# Patient Record
Sex: Male | Born: 1960 | Race: White | Hispanic: No | Marital: Married | Smoking: Never smoker
Health system: Southern US, Community
[De-identification: ages and names within clinical notes are randomized; demographics above are authoritative.]

## PROBLEM LIST (undated history)

## (undated) DIAGNOSIS — R030 Elevated blood-pressure reading, without diagnosis of hypertension: Secondary | ICD-10-CM

## (undated) DIAGNOSIS — E785 Hyperlipidemia, unspecified: Secondary | ICD-10-CM

## (undated) HISTORY — DX: Elevated blood-pressure reading, without diagnosis of hypertension: R03.0

## (undated) HISTORY — DX: Hyperlipidemia, unspecified: E78.5

---

## 2002-05-25 ENCOUNTER — Ambulatory Visit (HOSPITAL_COMMUNITY): Admission: RE | Admit: 2002-05-25 | Discharge: 2002-05-25 | Payer: Self-pay | Admitting: Gastroenterology

## 2003-04-15 ENCOUNTER — Encounter: Admission: RE | Admit: 2003-04-15 | Discharge: 2003-04-15 | Payer: Self-pay | Admitting: Internal Medicine

## 2003-04-15 ENCOUNTER — Encounter: Payer: Self-pay | Admitting: Internal Medicine

## 2003-12-09 ENCOUNTER — Other Ambulatory Visit: Admission: RE | Admit: 2003-12-09 | Discharge: 2003-12-09 | Payer: Self-pay | Admitting: Otolaryngology

## 2008-07-19 ENCOUNTER — Ambulatory Visit (HOSPITAL_BASED_OUTPATIENT_CLINIC_OR_DEPARTMENT_OTHER): Admission: RE | Admit: 2008-07-19 | Discharge: 2008-07-19 | Payer: Self-pay | Admitting: Orthopaedic Surgery

## 2008-10-05 ENCOUNTER — Encounter: Admission: RE | Admit: 2008-10-05 | Discharge: 2008-10-05 | Payer: Self-pay | Admitting: Internal Medicine

## 2010-01-02 IMAGING — US US SOFT TISSUE HEAD/NECK
1 series · 14 of 25 positions shown · non-contrast
Comparison: None

CLINICAL DATA: Thyroid enlargement/swelling.  Fatigue

THYROID ULTRASOUND
TECHNIQUE: Ultrasound examination of the thyroid gland and
adjacent soft tissues was performed.

[Series 1: us soft tissue head/neck · 0.10mm/px · 14 of 28 slices shown]
[im 1/28]
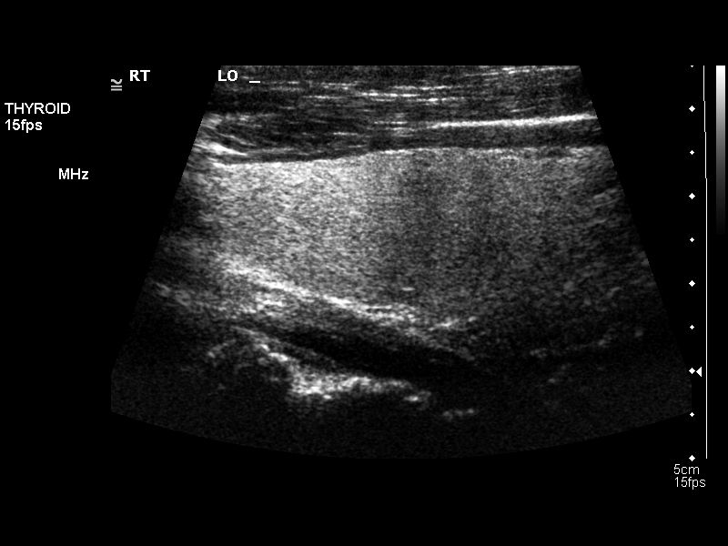
[im 3/28]
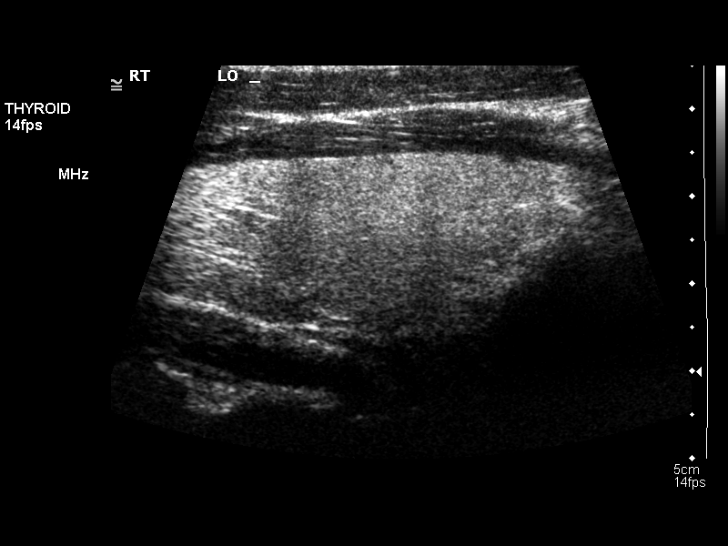
[im 5/28]
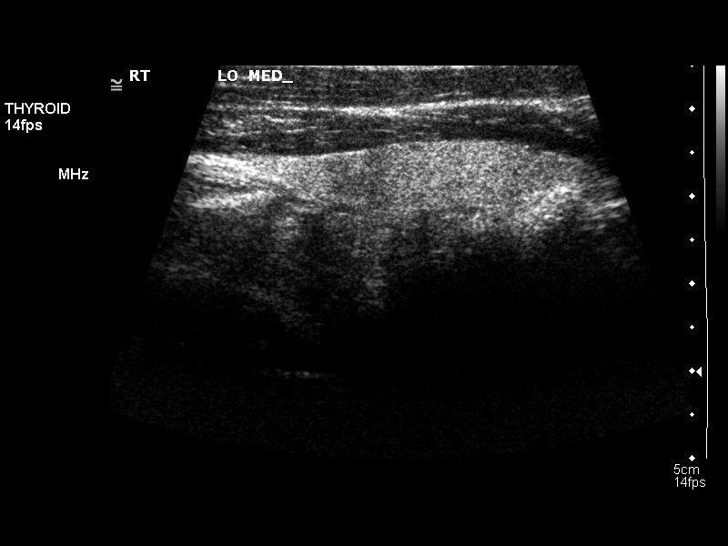
[im 7/28]
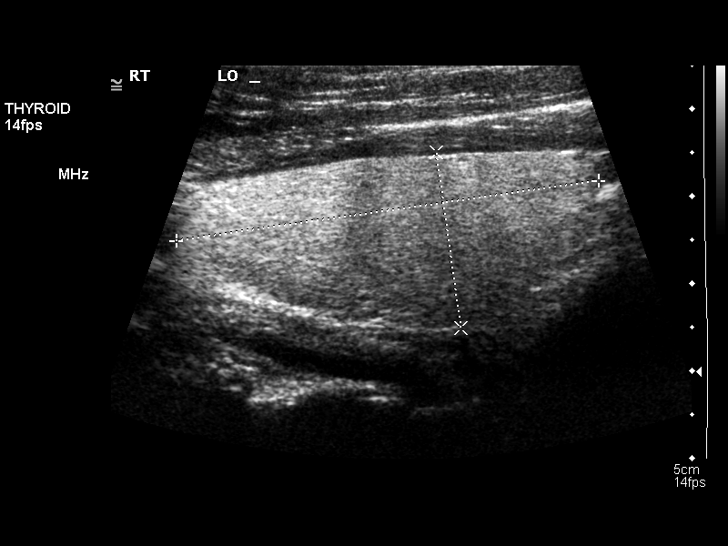
[im 10/28]
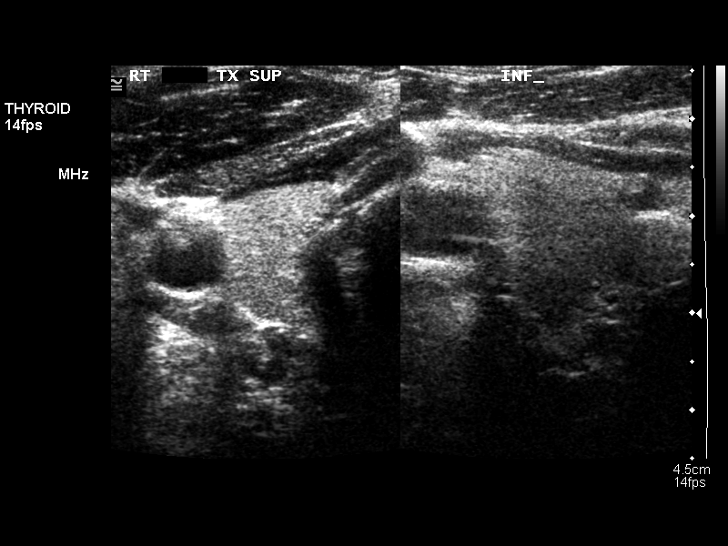
[im 11/28]
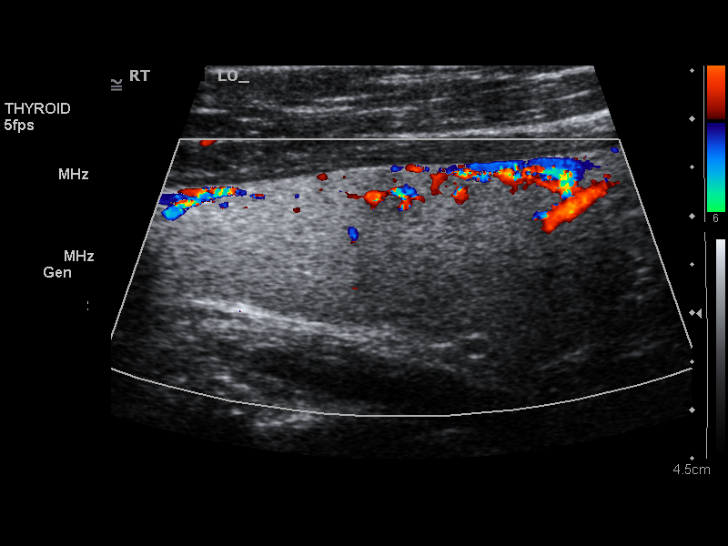
[im 13/28]
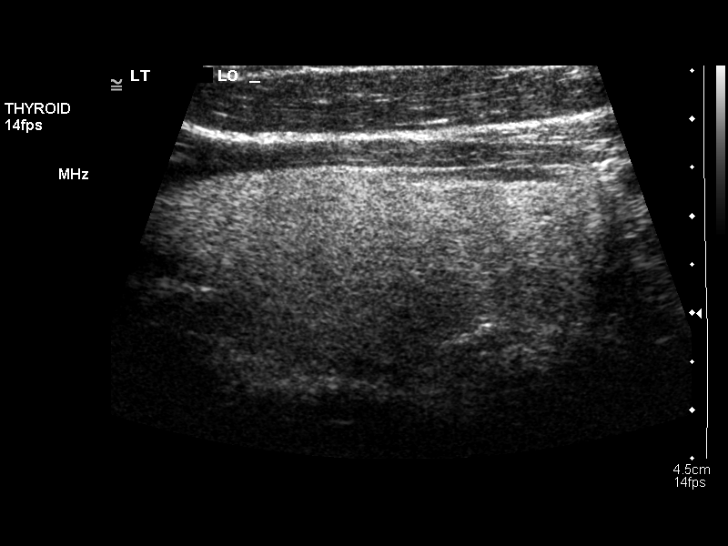
[im 15/28]
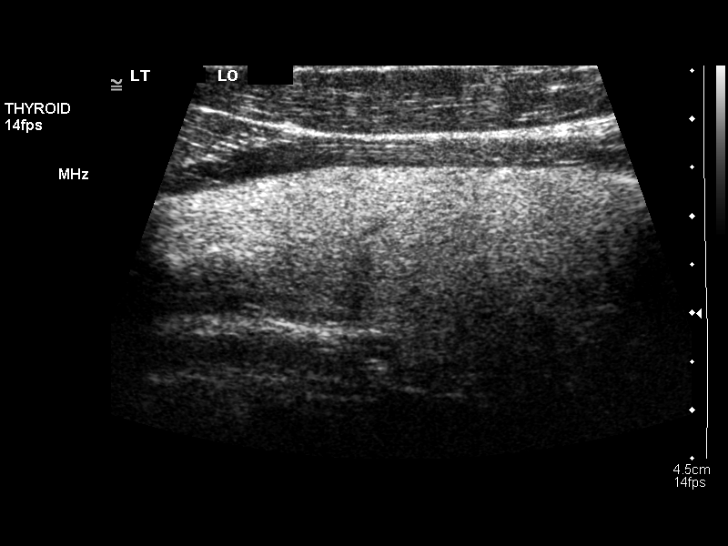
[im 17/28]
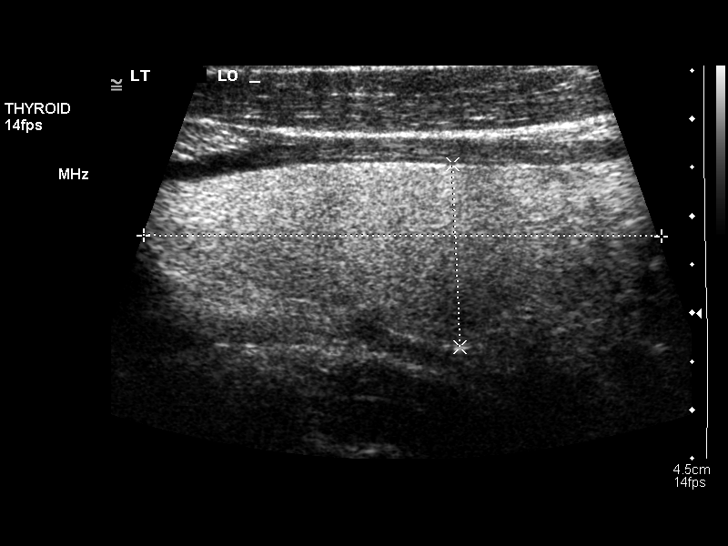
[im 19/28]
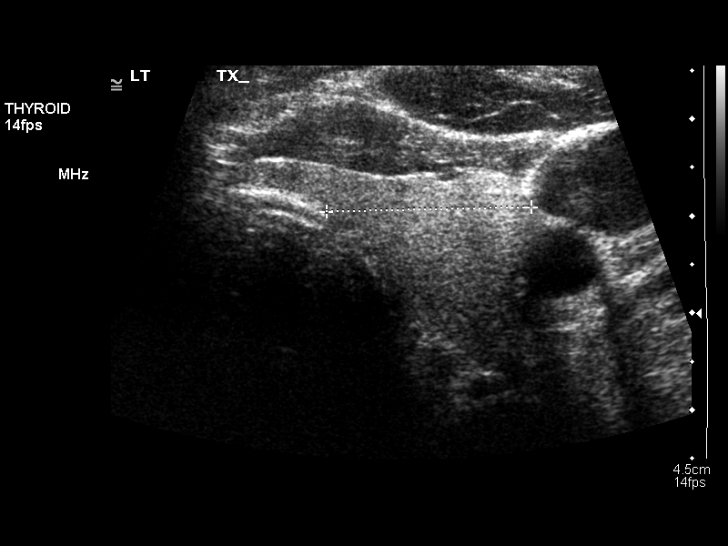
[im 21/28]
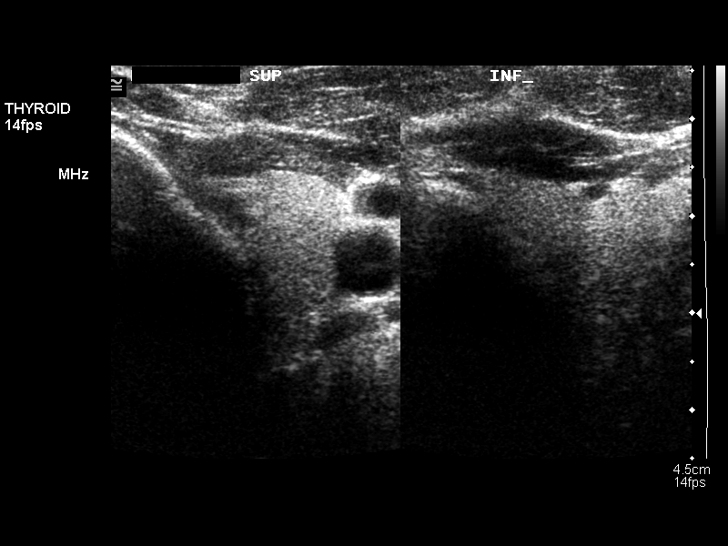
[im 23/28]
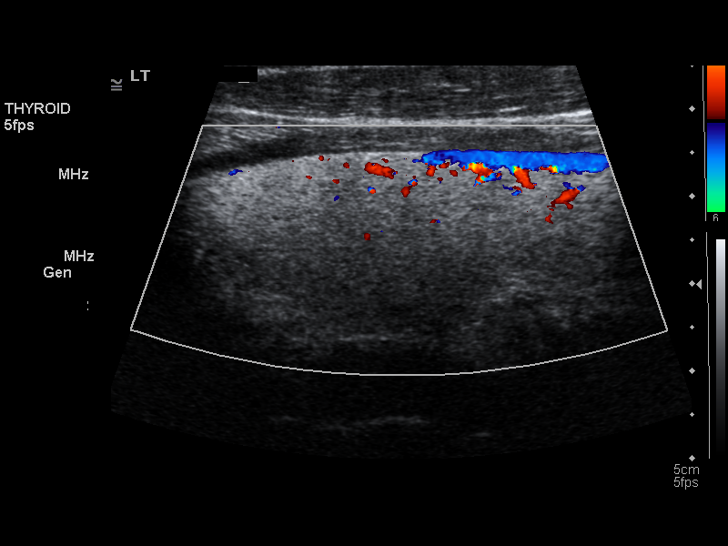
[im 25/28]
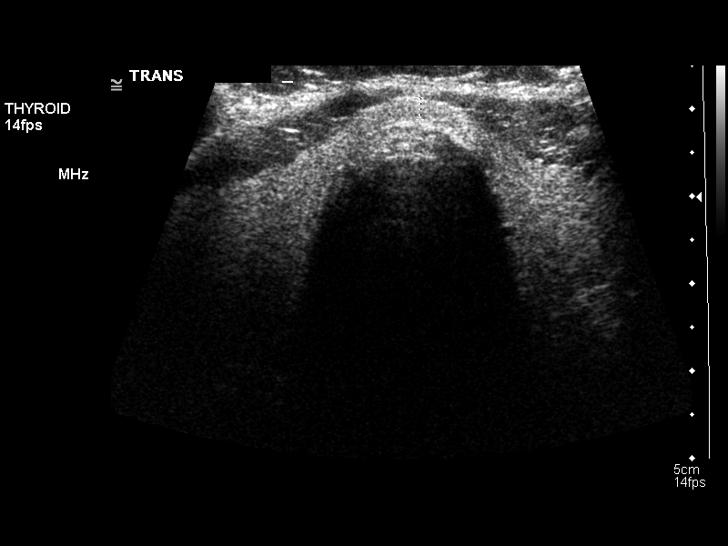
[im 28/28]
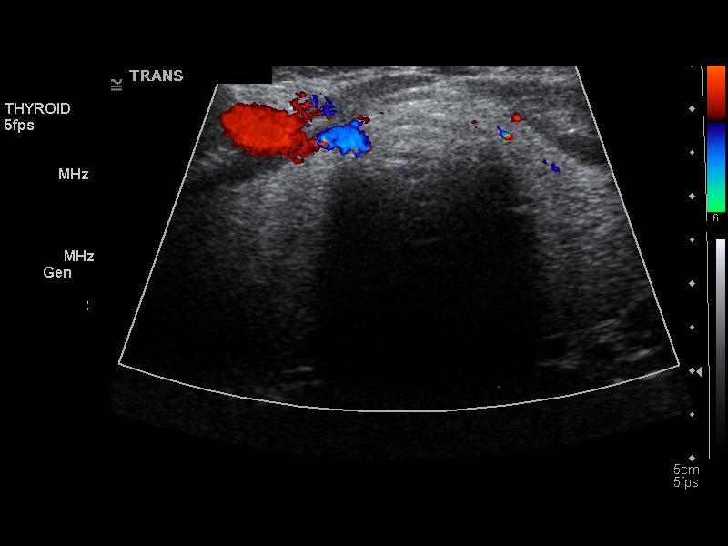

[14 of 25 positions shown; findings below may reference images not displayed]

FINDINGS: The thyroid gland is homogeneous in echotexture.  No
nodules are identified.  The right lobe measures 5.2 x 1.8 x 2.7 cm
and the left lobe measures 5.3 x 2.0 x 2.1 cm.
IMPRESSION: Normal thyroid ultrasound.

## 2010-06-30 ENCOUNTER — Emergency Department (HOSPITAL_BASED_OUTPATIENT_CLINIC_OR_DEPARTMENT_OTHER): Admission: EM | Admit: 2010-06-30 | Discharge: 2010-06-30 | Payer: Self-pay | Admitting: Emergency Medicine

## 2011-02-19 NOTE — Op Note (Signed)
Mario Burke, Mario Burke               ACCOUNT NO.:  [id number]   MEDICAL RECORD NO.:  [id number]          PATIENT TYPE:  AMB   LOCATION:  DSC                          FACILITY:  [facility]   PHYSICIAN:  Lubertha Basque. Dalldorf, M.D.DATE OF BIRTH:  1961/02/03   DATE OF PROCEDURE:  07/19/2008  DATE OF DISCHARGE:                               OPERATIVE REPORT   PREOPERATIVE DIAGNOSES:  1. Left knee torn medial meniscus.  2. Left knee chondromalacia patella.   POSTOPERATIVE DIAGNOSES:  1. Left knee torn medial meniscus.  2. Left knee chondromalacia patella.   PROCEDURES:  1. Left knee partial medial meniscectomy.  2. Left knee abrasion and chondroplasty, patellofemoral.   ANESTHESIA:  General.   ATTENDING SURGEON:  Lubertha Basque. Jerl Santos, MD   ASSISTANT:  Lindwood Qua, PA   INDICATIONS FOR PROCEDURE:  The patient is a 50 year old male with about  3 months of left knee pain with mechanical symptoms.  This may have  started on playground with his child running up a slide.  Nevertheless,  he has persisted with pain and swelling and some mechanical catching.  By exam and history, he is presumed to have a displaced meniscal tear.  He is offered an arthroscopy.  Informed operative consent was obtained  after discussion of possible complications of reaction to anesthesia and  infection.   SUMMARY/FINDINGS/PROCEDURE:  Under general anesthesia, an arthroscopy of  the left knee was performed.  Suprapatellar pouch was benign while the  patellofemoral joint exhibited grade 3 and 4 breakdown in the  intertrochlear groove over a relatively broad area.  A thorough  chondroplasty was done along with abrasion to bleeding bone in a tiny  location.  In the medial compartment, he did have a displaced medial  meniscus tear, bucket handle stuck in an anterior position.  This was  removed, performing about a 25% partial medial meniscectomy.  This was  taken back to stable tissues.  There were no degenerative  changes in  this compartment.  ACL was intact.  Lateral compartment exhibited an  insignificant central tear addressed with a brief partial lateral  meniscectomy, but there were no degenerative changes in this  compartment.   DESCRIPTION OF PROCEDURE:  The patient was taken to the operating suite  where general anesthetic was applied without difficulty.  He was  positioned supine and prepped and draped in normal sterile fashion.  After the administration of IV Kefzol, an arthroscopy of the left knee  was performed through a total of 2 portals.  Findings were as noted  above and procedure consisted of the partial medial meniscectomy done  with basket and shaver followed by the abrasion chondroplasty to  bleeding bone in the intertrochlear groove.  The knee was thoroughly  irrigated followed by placement Marcaine with epinephrine and morphine.  Adaptic was placed over the portals followed by dry gauze and loose Ace  wrap.  Estimated blood loss and intraoperative fluids can be obtained  from the anesthesia records.   DISPOSITION:  The patient was extubated in the operating room and taken  to recovery room in stable addition.  He  is to go home same-day and  follow up in the office closely.  I will contact him by phone tonight.      Lubertha Basque Jerl Santos, M.D.  Electronically Signed     PGD/MEDQ  D:  07/19/2008  T:  07/20/2008  Job:  [id number]

## 2012-02-18 DIAGNOSIS — F329 Major depressive disorder, single episode, unspecified: Secondary | ICD-10-CM | POA: Insufficient documentation

## 2013-07-09 ENCOUNTER — Telehealth: Payer: Self-pay | Admitting: *Deleted

## 2013-07-09 NOTE — Telephone Encounter (Signed)
Pt would like to reorder orthotic, please call.

## 2013-07-13 NOTE — Telephone Encounter (Signed)
Mario Burke  WE WILL GO TO CLASS ON TOMORROW 07/14/13 I WILL ASK JUSTIN WHAT WE ARE TO DO REGARDING OUR ORTHOTIC PATIENT RECEIVING A SECOND PAIR WHEN THEY ARE NOT SEEING THE DOCTOR. WILL LET  YOU KNOW REGARDING THIS PATIENT.

## 2013-07-13 NOTE — Telephone Encounter (Signed)
Can you check if patient is filing these with insurance or paying out of pocket before I order these?

## 2013-07-13 NOTE — Telephone Encounter (Signed)
Spoke to pt...requested that we check his insurance first to see if covered and at how much. Pt has an old pair that he may just get refurbished if his cost ends up being higher. Can you call his insurance and check coverage and let the pt know?

## 2013-07-21 ENCOUNTER — Encounter (INDEPENDENT_AMBULATORY_CARE_PROVIDER_SITE_OTHER): Payer: Self-pay

## 2013-07-21 ENCOUNTER — Encounter: Payer: Self-pay | Admitting: Cardiovascular Disease

## 2013-07-21 ENCOUNTER — Ambulatory Visit (INDEPENDENT_AMBULATORY_CARE_PROVIDER_SITE_OTHER): Payer: BC Managed Care – PPO | Admitting: Cardiovascular Disease

## 2013-07-21 VITALS — BP 134/92 | HR 79 | Ht 75.5 in | Wt 240.0 lb

## 2013-07-21 DIAGNOSIS — R0600 Dyspnea, unspecified: Secondary | ICD-10-CM | POA: Insufficient documentation

## 2013-07-21 DIAGNOSIS — R079 Chest pain, unspecified: Secondary | ICD-10-CM

## 2013-07-21 DIAGNOSIS — R0609 Other forms of dyspnea: Secondary | ICD-10-CM

## 2013-07-21 DIAGNOSIS — D582 Other hemoglobinopathies: Secondary | ICD-10-CM

## 2013-07-21 LAB — BASIC METABOLIC PANEL
Calcium: 9.5 mg/dL (ref 8.4–10.5)
GFR: 87.12 mL/min (ref >60.00)
Potassium: 4.4 mEq/L (ref 3.5–5.1)
Sodium: 137 mEq/L (ref 135–145)

## 2013-07-21 LAB — HEMOGLOBIN: Hemoglobin: 16.5 g/dL (ref 13.0–17.0)

## 2013-07-21 LAB — D-DIMER, QUANTITATIVE: D-Dimer, Quant: 0.44 ug/mL-FEU (ref 0.00–0.48)

## 2013-07-21 LAB — HEMATOCRIT: HCT: 46.8 % (ref 39.0–52.0)

## 2013-07-21 NOTE — Patient Instructions (Signed)
Your physician recommends that you schedule a follow-up appointment in:  AS NEEDED   Your physician has requested that you have an echocardiogram. Echocardiography is a painless test that uses sound waves to create images of your heart. It provides your doctor with information about the size and shape of your heart and how well your heart's chambers and valves are working. This procedure takes approximately one hour. There are no restrictions for this procedure.   Your physician has requested that you have an exercise tolerance test. For further information please visit [email or web address] Please also follow instruction sheet, as given.  Your physician recommends that you return for lab work in:  TODAY   HGB  HCT  BMET   D DIMER

## 2013-07-21 NOTE — Assessment & Plan Note (Signed)
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  RBC mass No signs of hypoxia  Echo

## 2013-07-21 NOTE — Assessment & Plan Note (Signed)
CXR normal Check d dimer and echo for RV/LV function

## 2013-07-21 NOTE — Progress Notes (Signed)
Patient ID: Mario Burke, male   DOB: Feb 15, 1961, 52 y.o.   MRN: [id number] 52 yo referred by [facility] urgent care Seen there 9/28 for chest pain and dyspnea Sudden onset while working in yard.  Tightness in chest No palpitations or syncope.  Reviewed records.  ECG ok. CXR ok  R/O  Did not see d dimer.  Hb was elevated at 17.7  BUN Cr normal CPK elevated in 400-500 range low MB and negative Troponin T  Had gotten hot and maybe dehydrated.  Pain squeezing and intermitant Lasted overall a few hours.  Not positional or pleuritic dyspnea was sudden in onset.  Not smoker with no chronic lung disease cough fever or sputum  ROS: Denies fever, malais, weight loss, blurry vision, decreased visual acuity, cough, sputum, SOB, hemoptysis, pleuritic pain, palpitaitons, heartburn, abdominal pain, melena, lower extremity edema, claudication, or rash.  All other systems reviewed and negative   General: Affect appropriate Healthy:  appears stated age HEENT: normal Neck supple with no adenopathy JVP normal no bruits no thyromegaly Lungs clear with no wheezing and good diaphragmatic motion Heart:  S1/S2 no murmur,rub, gallop or click PMI normal Abdomen: benighn, BS positve, no tenderness, no AAA no bruit.  No HSM or HJR Distal pulses intact with no bruits No edema Neuro non-focal Skin warm and dry No muscular weakness  Medications Current Outpatient Prescriptions  Medication Sig Dispense Refill  . ALPRAZolam (XANAX) 0.5 MG tablet Take half tablet about one or two times a week as needed      . testosterone cypionate (DEPOTESTOTERONE CYPIONATE) 100 MG/ML injection Inject 200 mg into the muscle every 14 (fourteen) days. For IM use only      . venlafaxine XR (EFFEXOR-XR) 150 MG 24 hr capsule Take 150 mg by mouth daily.       No current facility-administered medications for this visit.    Allergies Review of patient's allergies indicates not on file.  Family History: No family history on  file.  Social History: History   Social History  . Marital Status: Married    Spouse Name: N/A    Number of Children: N/A  . Years of Education: N/A   Occupational History  . Not on file.   Social History Main Topics  . Smoking status: Never Smoker   . Smokeless tobacco: Not on file  . Alcohol Use: Yes     Comment: occasionally  . Drug Use: No  . Sexual Activity: Not on file   Other Topics Concern  . Not on file   Social History Narrative  . No narrative on file    Electrocardiogram:  SR rate 71  Normal   Assessment and Plan

## 2013-07-21 NOTE — Assessment & Plan Note (Signed)
Resolved normal ECG  F/U ETT

## 2013-07-27 ENCOUNTER — Other Ambulatory Visit: Payer: Self-pay | Admitting: *Deleted

## 2013-07-27 DIAGNOSIS — M722 Plantar fascial fibromatosis: Secondary | ICD-10-CM

## 2013-07-27 NOTE — Telephone Encounter (Signed)
Ordered pt's orthotics today. Will call when they arrive!

## 2013-07-27 NOTE — Telephone Encounter (Signed)
Did pt get a call re: insurance coverage with orthotics?

## 2013-08-04 ENCOUNTER — Ambulatory Visit (HOSPITAL_COMMUNITY): Payer: BC Managed Care – PPO | Attending: Cardiovascular Disease | Admitting: Radiology

## 2013-08-04 DIAGNOSIS — I079 Rheumatic tricuspid valve disease, unspecified: Secondary | ICD-10-CM | POA: Insufficient documentation

## 2013-08-04 DIAGNOSIS — R079 Chest pain, unspecified: Secondary | ICD-10-CM

## 2013-08-04 DIAGNOSIS — R0600 Dyspnea, unspecified: Secondary | ICD-10-CM

## 2013-08-04 DIAGNOSIS — R0602 Shortness of breath: Secondary | ICD-10-CM

## 2013-08-04 DIAGNOSIS — R072 Precordial pain: Secondary | ICD-10-CM

## 2013-08-04 NOTE — Progress Notes (Signed)
Echocardiogram performed.  

## 2013-08-12 ENCOUNTER — Ambulatory Visit (INDEPENDENT_AMBULATORY_CARE_PROVIDER_SITE_OTHER): Payer: BC Managed Care – PPO | Admitting: Podiatrist

## 2013-08-12 VITALS — BP 124/84 | HR 84 | Resp 12

## 2013-08-12 DIAGNOSIS — M722 Plantar fascial fibromatosis: Secondary | ICD-10-CM

## 2013-08-12 NOTE — Progress Notes (Signed)
Patient presents today to pick up his orthotics. Orthotics are dispensed. He will be seen back for recheck.

## 2013-08-12 NOTE — Patient Instructions (Signed)

## 2013-08-25 ENCOUNTER — Encounter: Payer: Self-pay | Admitting: Nurse Practitioner

## 2013-08-25 ENCOUNTER — Ambulatory Visit (INDEPENDENT_AMBULATORY_CARE_PROVIDER_SITE_OTHER): Payer: BC Managed Care – PPO | Admitting: Nurse Practitioner

## 2013-08-25 VITALS — BP 145/94 | HR 92

## 2013-08-25 DIAGNOSIS — R079 Chest pain, unspecified: Secondary | ICD-10-CM

## 2013-08-25 NOTE — Progress Notes (Signed)
Exercise Treadmill Test  Pre-Exercise Testing Evaluation Rhythm: normal sinus  Rate: 74 bpm     Test  Exercise Tolerance Test Ordering MD: Charlton Haws, MD  Interpreting MD: Norma Fredrickson, NP  Unique Test No: 1  Treadmill:  1  Indication for ETT: chest pain - rule out ischemia  Contraindication to ETT: No   Stress Modality: exercise - treadmill  Cardiac Imaging Performed: non   Protocol: standard Bruce - maximal  Max BP:  195/88  Max MPHR (bpm):  168 85% MPR (bpm):  143  MPHR obtained (bpm):  173 % MPHR obtained:  101%  Reached 85% MPHR (min:sec):  6:28 Total Exercise Time (min-sec):  8:45  Workload in METS:  10.1 Borg Scale: 17  Reason ETT Terminated:  desired heart rate attained    ST Segment Analysis At Rest: normal ST segments - no evidence of significant ST depression With Exercise: no evidence of significant ST depression  Other Information Arrhythmia:  No Angina during ETT:  absent (0) Quality of ETT:  diagnostic  ETT Interpretation:  normal - no evidence of ischemia by ST analysis  Comments: Patient presents today for routine GXT. Has had spell of chest discomfort with dyspnea with negative ER evaluation. Has borderline HTN and HLD (not treated).   Today the patient exercised on the standard Bruce protocol for a total of 8:45 minutes.  Good exercise tolerance.  Adequate blood pressure response.  Clinically negative for chest pain. Test was stopped due to achievement of target HR.  EKG negative for ischemia. No significant arrhythmia noted.   Recommendations: CV risk factor modification  See back prn.  Patient is agreeable to this plan and will call if any problems develop in the interim.   Rosalio Macadamia, RN, ANP-C [facility] [address] [place], [place]  [address]

## 2016-12-10 ENCOUNTER — Telehealth: Payer: Self-pay

## 2016-12-10 NOTE — Telephone Encounter (Signed)
SENT NOTES TO SCHEDULING 

## 2017-12-12 ENCOUNTER — Ambulatory Visit (INDEPENDENT_AMBULATORY_CARE_PROVIDER_SITE_OTHER): Payer: BLUE CROSS/BLUE SHIELD | Admitting: Psychiatry

## 2017-12-12 ENCOUNTER — Encounter (HOSPITAL_COMMUNITY): Payer: Self-pay | Admitting: Psychiatry

## 2017-12-12 VITALS — BP 120/80 | HR 96 | Ht 75.0 in | Wt 239.0 lb

## 2017-12-12 DIAGNOSIS — F411 Generalized anxiety disorder: Secondary | ICD-10-CM | POA: Diagnosis not present

## 2017-12-12 DIAGNOSIS — Z818 Family history of other mental and behavioral disorders: Secondary | ICD-10-CM

## 2017-12-12 DIAGNOSIS — F331 Major depressive disorder, recurrent, moderate: Secondary | ICD-10-CM | POA: Diagnosis not present

## 2017-12-12 DIAGNOSIS — R450 Nervousness: Secondary | ICD-10-CM

## 2017-12-12 DIAGNOSIS — G4700 Insomnia, unspecified: Secondary | ICD-10-CM | POA: Diagnosis not present

## 2017-12-12 MED ORDER — PAROXETINE HCL 20 MG PO TABS: 20.0000 mg | ORAL_TABLET | Freq: Every day | ORAL | 0 refills | Status: DC

## 2017-12-12 MED ORDER — VENLAFAXINE HCL 37.5 MG PO TABS: 37.5000 mg | ORAL_TABLET | Freq: Every day | ORAL | 0 refills | Status: DC

## 2017-12-12 NOTE — Progress Notes (Signed)
Psychiatric Initial Adult Assessment   Patient Identification: Mario Burke MRN:  [id number] Date of Evaluation:  12/12/2017 Referral Source: primary care Chief Complaint:   Chief Complaint    Establish Care; Other     Visit Diagnosis:    ICD-10-CM   1. Major depressive disorder, recurrent episode, moderate (HCC) F33.1   2. GAD (generalized anxiety disorder) F41.1     History of Present Illness:  57 years old really married white" male referred for management of depression and anxiety  Patient states suffering from depression since her early 3820s and he has been on medication mostly Effexor since then on and off he has tried to stop that as well current dose 75 mg he has been on 150 mg in the past.  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 Because of the change in economy business is going down finances going down that as an added stress. He is worried about stopping Effexor because he has withdrawal but he does want to stop it or cut it down. He has been on different medications including Prozac in the past which caused headache Zoloft made him more sleepy.  Takes requip for restless legs at night and also prn ativan to fall asleep otheriwse has worries that keeps him up Modifying factors his wife and 58 year old son  There is no clear manic symptoms he does have some episode of increased energy but it was many years ago and did not last long. Mostly down depressive days at times he feels like crying and emotionally comes withdrawn a motivated and does not want to indulge in his activities of business  Duration is more than 20 years next progress severity is 4 out of 10. 10 being no depression  No psychotic like symptoms denies drug use last use of marijuana was one year ago. Before that he was using it more  regularly.    Associated Signs/Symptoms: Depression Symptoms:  depressed mood, fatigue, anxiety, disturbed sleep, (Hypo) Manic Symptoms:  Distractibility, Anxiety Symptoms:  Excessive Worry, Psychotic Symptoms:  denies PTSD Symptoms: NA  Past Psychiatric History: depression  Previous Psychotropic Medications: Yes   Substance Abuse History in the last 12 months:  Yes.    Consequences of Substance Abuse: Medical Consequences:  amotivation, depression.  Past Medical History:  Past Medical History:  Diagnosis Date  . Borderline systolic HTN   . HLD (hyperlipidemia)    History reviewed. No pertinent surgical history.  Family Psychiatric History: Mother depression Brother bipolar  Family History:  Family History  Problem Relation Age of Onset  . Heart disease Father     Social History:   Social History   Socioeconomic History  . Marital status: Married    Spouse name: None  . Number of children: None  . Years of education: None  . Highest education level: None  Social Needs  . Financial resource strain: None  . Food insecurity - worry: None  . Food insecurity - inability: None  . Transportation needs - medical: None  . Transportation needs - non-medical: None  Occupational History  . None  Tobacco Use  . Smoking status: Never Smoker  . Smokeless tobacco: Never Used  Substance and Sexual Activity  . Alcohol use: Yes    Comment: occasionally  . Drug use: No  . Sexual activity: None  Other Topics Concern  . None  Social  History Narrative  . None    Additional Social History: grew up with parents. Also brother. 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  Allergies:   Allergies  Allergen Reactions  .  Epinephrine     Shortness of breath, sweats    Metabolic Disorder Labs: No results found for: HGBA1C, MPG No results found for: PROLACTIN No results found for: CHOL, TRIG, HDL, CHOLHDL, VLDL, LDLCALC   Current Medications: Current Outpatient Medications  Medication Sig Dispense Refill  . LORazepam (ATIVAN) 1 MG tablet Take 1 mg by mouth as needed for anxiety.    Marland Kitchen testosterone cypionate (DEPOTESTOTERONE CYPIONATE) 100 MG/ML injection Inject 200 mg into the muscle every 14 (fourteen) days. For IM use only    . PARoxetine (PAXIL) 20 MG tablet Take 1 tablet (20 mg total) by mouth daily. Take half tablet a day for first week then start with one 30 tablet 0  . venlafaxine (EFFEXOR) 37.5 MG tablet Take 1 tablet (37.5 mg total) by mouth daily. 15 tablet 0   No current facility-administered medications for this visit.     Neurologic: Headache: No Seizure: No Paresthesias:No  Musculoskeletal: Strength & Muscle Tone: within normal limits Gait & Station: normal Patient leans: no lean  Psychiatric Specialty Exam: Review of Systems  Cardiovascular: Negative for chest pain.  Skin: Negative for rash.  Psychiatric/Behavioral: Positive for depression. Negative for suicidal ideas. The patient is nervous/anxious.     Blood pressure 120/80, pulse 96, height 6\' 3"  (1.905 m), weight 239 lb (108.4 kg).Body mass index is 29.87 kg/m.  General Appearance: Casual  Eye Contact:  Good  Speech:  Normal Rate  Volume:  Decreased  Mood:  Dysphoric  Affect:  Congruent  Thought Process:  Goal Directed  Orientation:  Full (Time, Place, and Person)  Thought Content:  Rumination  Suicidal Thoughts:  No  Homicidal Thoughts:  No  Memory:  Immediate;   Fair Recent;   Poor  Judgement:  Fair  Insight:  Fair  Psychomotor Activity:  Decreased  Concentration:  Concentration: Fair and Attention Span: Fair  Recall:  [employer] of Knowledge:Fair  Language: Fair  Akathisia:  Negative  Handed:  Right  AIMS  (if indicated):  0  Assets:  Desire for Improvement  ADL's:  Intact  Cognition: WNL  Sleep:  Fair to variable poor at times    Treatment Plan Summary: Medication management and Plan as follows  1. Major depression recurrent moderate: taper down effexor to 37.5mg . Start paxil 10mg  increase to 20mg  in one week 2. GAD: start paxil as above 3. Insomnia: reviewed sleep hygiene. Avoid ativan . Add more fruits at night.  Takes roperinole for restless legs at night  Refer to therapy to deal with anxiety, financial stress and coping skills More than 50% time spent in counseling and coordination of care including patient education and review side effects and concerns were addressed fu4w or earlier for concerns  Thresa Ross, MD 3/8/20199:37 AM

## 2018-01-09 ENCOUNTER — Ambulatory Visit (INDEPENDENT_AMBULATORY_CARE_PROVIDER_SITE_OTHER): Payer: BLUE CROSS/BLUE SHIELD | Admitting: Psychiatry

## 2018-01-09 ENCOUNTER — Other Ambulatory Visit: Payer: Self-pay

## 2018-01-09 ENCOUNTER — Encounter (HOSPITAL_COMMUNITY): Payer: Self-pay | Admitting: Psychiatry

## 2018-01-09 VITALS — BP 126/82 | HR 77 | Ht 75.0 in | Wt 238.0 lb

## 2018-01-09 DIAGNOSIS — F331 Major depressive disorder, recurrent, moderate: Secondary | ICD-10-CM

## 2018-01-09 DIAGNOSIS — F411 Generalized anxiety disorder: Secondary | ICD-10-CM | POA: Diagnosis not present

## 2018-01-09 DIAGNOSIS — R450 Nervousness: Secondary | ICD-10-CM

## 2018-01-09 DIAGNOSIS — G4700 Insomnia, unspecified: Secondary | ICD-10-CM

## 2018-01-09 MED ORDER — PAROXETINE HCL 30 MG PO TABS: 30.0000 mg | ORAL_TABLET | Freq: Every day | ORAL | 1 refills | Status: DC

## 2018-01-09 NOTE — Progress Notes (Signed)
[facility]  Patient Identification: Mario MauRussell E Curtice MRN:  [id number] Date of Evaluation:  01/09/2018 Referral Source: primary care Chief Complaint:   Chief Complaint    Follow-up; Other     Visit Diagnosis:    ICD-10-CM   1. Major depressive disorder, recurrent episode, moderate (HCC) F33.1   2. GAD (generalized anxiety disorder) F41.1     History of Present Illness:  57 years old really married white" male initially referred for management of depression and anxiety   Has suffered from depression since age 57's. 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 7/10. 10 being no depression       Substance Abuse History in the last 12 months:  Yes.    Consequences of Substance Abuse: Medical Consequences:  amotivation, depression.  Past Medical History:  Past Medical History:  Diagnosis Date  . Borderline systolic HTN   . HLD (hyperlipidemia)    History reviewed. No pertinent surgical history.  Family Psychiatric History: Mother depression Brother bipolar  Family History:  Family History  Problem Relation Age of Onset  . Heart disease Father     Social History:   Social History   Socioeconomic History  . Marital status: Married    Spouse name: Not on file  . Number of children: Not on file  . Years of education: Not on file  . Highest education level: Not on file  Occupational History  . Not on file  Social Needs  . Financial resource strain: Not on file  . Food insecurity:    Worry: Not on file    Inability: Not on file  . Transportation needs:    Medical: Not on file    Non-medical: Not on file   Tobacco Use  . Smoking status: Never Smoker  . Smokeless tobacco: Never Used  Substance and Sexual Activity  . Alcohol use: Yes    Comment: occasionally  . Drug use: No  . Sexual activity: Not on file  Lifestyle  . Physical activity:    Days per week: Not on file    Minutes per session: Not on file  . Stress: Not on file  Relationships  . Social connections:    Talks on phone: Not on file    Gets together: Not on file    Attends religious service: Not on file    Active member of club or organization: Not on file    Attends meetings of clubs or organizations: Not on file    Relationship status: Not on file  Other Topics Concern  . Not on file  Social History Narrative  . Not on file     Allergies:   Allergies  Allergen Reactions  . Epinephrine     Shortness of breath, sweats    Metabolic Disorder Labs: No results found for: HGBA1C, MPG No results found for: PROLACTIN No results found for: CHOL, TRIG, HDL, CHOLHDL, VLDL, LDLCALC   Current Medications: Current Outpatient Medications  Medication Sig Dispense Refill  . LORazepam (ATIVAN) 1 MG tablet Take 1 mg by mouth as needed for anxiety.    Marland Kitchen. PARoxetine (PAXIL) 30 MG tablet Take  1 tablet (30 mg total) by mouth daily. 30 tablet 1  . testosterone cypionate (DEPOTESTOTERONE CYPIONATE) 100 MG/ML injection Inject 200 mg into the muscle every 14 (fourteen) days. For IM use only     No current facility-administered medications for this visit.       Psychiatric Specialty Exam: Review of Systems  Cardiovascular: Negative for chest pain.  Skin: Negative for rash.  Psychiatric/Behavioral: Positive for depression. Negative for suicidal ideas. The patient is nervous/anxious.     Blood pressure 126/82, pulse 77, height 6\' 3"  (1.905 m), weight 238 lb (108 kg).Body mass index is 29.75 kg/m.  General Appearance: Casual  Eye Contact:  Good  Speech:  Normal Rate  Volume:  Decreased  Mood:  Fair and better  Affect:   Congruent  Thought Process:  Goal Directed  Orientation:  Full (Time, Place, and Person)  Thought Content:  Rumination  Suicidal Thoughts:  No  Homicidal Thoughts:  No  Memory:  Immediate;   Fair Recent;   Poor  Judgement:  Fair  Insight:  Fair  Psychomotor Activity:  Decreased  Concentration:  Concentration: Fair and Attention Span: Fair  Recall:  [employer] of Knowledge:Fair  Language: Fair  Akathisia:  Negative  Handed:  Right  AIMS (if indicated):  0  Assets:  Desire for Improvement  ADL's:  Intact  Cognition: WNL  Sleep:  Fair to variable poor at times    Treatment Plan Summary: Medication management and Plan as follows  1. Major depression recurrent moderate:better. Increase paxil to 30mg  as he feels it has plateued.  2. GAD: fluctuates. Some better in crease paxil as above 3. Insomnia: better. Continue to work on sleep hgyiene avoid ativan  Fu 56m .  Thresa Ross, MD 4/5/201910:44 AM

## 2018-03-06 ENCOUNTER — Encounter (HOSPITAL_COMMUNITY): Payer: Self-pay | Admitting: Psychiatry

## 2018-03-06 ENCOUNTER — Other Ambulatory Visit: Payer: Self-pay

## 2018-03-06 ENCOUNTER — Ambulatory Visit (INDEPENDENT_AMBULATORY_CARE_PROVIDER_SITE_OTHER): Payer: BLUE CROSS/BLUE SHIELD | Admitting: Psychiatry

## 2018-03-06 VITALS — BP 142/90 | HR 63 | Ht 75.0 in | Wt 244.0 lb

## 2018-03-06 DIAGNOSIS — F331 Major depressive disorder, recurrent, moderate: Secondary | ICD-10-CM

## 2018-03-06 DIAGNOSIS — F411 Generalized anxiety disorder: Secondary | ICD-10-CM

## 2018-03-06 MED ORDER — TRAZODONE HCL 50 MG PO TABS: 50.0000 mg | ORAL_TABLET | Freq: Every day | ORAL | 1 refills | Status: DC

## 2018-03-06 MED ORDER — PAROXETINE HCL 30 MG PO TABS: 30.0000 mg | ORAL_TABLET | Freq: Every day | ORAL | 1 refills | Status: DC

## 2018-03-06 NOTE — Progress Notes (Signed)
[facility] Outpatient Follow up visit  Patient Identification: Mario Burke MRN:  [id number] Date of Evaluation:  03/06/2018 Referral Source: primary care Chief Complaint:   Chief Complaint    Follow-up; Other     Visit Diagnosis:    ICD-10-CM   1. Major depressive disorder, recurrent episode, moderate (HCC) F33.1   2. GAD (generalized anxiety disorder) F41.1     History of Present Illness:  57 years old really married white" male initially referred for management of depression and anxiety  Runs a printing business  paxil has helped compared to effexor in past.  Still has sleep issues and feels subdued at times  Less edgy   Takes requip for restless legs at night and also prn ativan to fall asleep otheriwse has worries that keeps him up Modifying factors wife Duration since age 38 Severity imprvoed now. 7/10. 10 being no depression       Substance Abuse History in the last 12 months:  Yes.    Consequences of Substance Abuse: Medical Consequences:  amotivation, depression.  Past Medical History:  Past Medical History:  Diagnosis Date  . Borderline systolic HTN   . HLD (hyperlipidemia)    History reviewed. No pertinent surgical history.  Family Psychiatric History: Mother depression Brother bipolar  Family History:  Family History  Problem Relation Age of Onset  . Heart disease Father     Social History:   Social History   Socioeconomic History  . Marital status: Married    Spouse name: Not on file  . Number of children: Not on file  . Years of education: Not on file  . Highest education level: Not on file  Occupational History  . Not on file  Social Needs  . Financial resource strain: Not on file  . Food insecurity:    Worry: Not on file    Inability: Not on file  . Transportation needs:    Medical: Not on file    Non-medical: Not on file  Tobacco Use  . Smoking status: Never Smoker  . Smokeless tobacco: Never Used  Substance and Sexual Activity   . Alcohol use: Yes    Comment: occasionally  . Drug use: No  . Sexual activity: Not on file  Lifestyle  . Physical activity:    Days per week: Not on file    Minutes per session: Not on file  . Stress: Not on file  Relationships  . Social connections:    Talks on phone: Not on file    Gets together: Not on file    Attends religious service: Not on file    Active member of club or organization: Not on file    Attends meetings of clubs or organizations: Not on file    Relationship status: Not on file  Other Topics Concern  . Not on file  Social History Narrative  . Not on file     Allergies:   Allergies  Allergen Reactions  . Epinephrine     Shortness of breath, sweats    Metabolic Disorder Labs: No results found for: HGBA1C, MPG No results found for: PROLACTIN No results found for: CHOL, TRIG, HDL, CHOLHDL, VLDL, LDLCALC   Current Medications: Current Outpatient Medications  Medication Sig Dispense Refill  . PARoxetine (PAXIL) 30 MG tablet Take 1 tablet (30 mg total) by mouth daily. 30 tablet 1  . testosterone cypionate (DEPOTESTOTERONE CYPIONATE) 100 MG/ML injection Inject 200 mg into the muscle every 14 (fourteen) days. For IM use only    .  LORazepam (ATIVAN) 1 MG tablet Take 1 mg by mouth as needed for anxiety.    . traZODone (DESYREL) 50 MG tablet Take 1 tablet (50 mg total) by mouth at bedtime. 30 tablet 1   No current facility-administered medications for this visit.       Psychiatric Specialty Exam: Review of Systems  Cardiovascular: Negative for palpitations.  Skin: Negative for rash.  Psychiatric/Behavioral: Negative for suicidal ideas. The patient has insomnia.     Blood pressure (!) 142/90, pulse 63, height  (1.905 m), weight 244 lb (110.7 kg).Body mass index is 30.5 kg/m.  General Appearance: Casual  Eye Contact:  Good  Speech:  Normal Rate  Volume:  Decreased  Mood:  fair  Affect:  Congruent  Thought Process:  Goal Directed   Orientation:  Full (Time, Place, and Person)  Thought Content:  Rumination  Suicidal Thoughts:  No  Homicidal Thoughts:  No  Memory:  Immediate;   Fair Recent;   Poor  Judgement:  Fair  Insight:  Fair  Psychomotor Activity:  Decreased  Concentration:  Concentration: Fair and Attention Span: Fair  Recall:  [employer] of Knowledge:Fair  Language: Fair  Akathisia:  Negative  Handed:  Right  AIMS (if indicated):  0  Assets:  Desire for Improvement  ADL's:  Intact  Cognition: WNL  Sleep:  Fair to variable poor at times    Treatment Plan Summary: Medication management and Plan as follows  1. Major depression recurrent moderate:some better.; continue paxil  2. GAD: fluctuates. Continue paxil 3. Insomnia: ongoing. Will add trazadone . Reviewed sid effects Fu 69m .  Thresa Ross, MD 5/31/201910:47 AM

## 2018-05-01 ENCOUNTER — Encounter (HOSPITAL_COMMUNITY): Payer: Self-pay | Admitting: Psychiatry

## 2018-05-01 ENCOUNTER — Ambulatory Visit (INDEPENDENT_AMBULATORY_CARE_PROVIDER_SITE_OTHER): Payer: BLUE CROSS/BLUE SHIELD | Admitting: Psychiatry

## 2018-05-01 ENCOUNTER — Other Ambulatory Visit: Payer: Self-pay

## 2018-05-01 VITALS — BP 120/88 | HR 62 | Ht 75.0 in | Wt 245.0 lb

## 2018-05-01 DIAGNOSIS — F411 Generalized anxiety disorder: Secondary | ICD-10-CM | POA: Diagnosis not present

## 2018-05-01 DIAGNOSIS — F331 Major depressive disorder, recurrent, moderate: Secondary | ICD-10-CM

## 2018-05-01 MED ORDER — PAROXETINE HCL 30 MG PO TABS: 30.0000 mg | ORAL_TABLET | Freq: Every day | ORAL | 2 refills | Status: DC

## 2018-05-01 MED ORDER — TRAZODONE HCL 50 MG PO TABS: 50.0000 mg | ORAL_TABLET | Freq: Every day | ORAL | 1 refills | Status: DC

## 2018-05-01 NOTE — Progress Notes (Signed)
[facility] Outpatient Follow up visit  Patient Identification: Mario MauRussell E Burke MRN:  [id number] Date of Evaluation:  05/01/2018 Referral Source: primary care Chief Complaint:   Chief Complaint    Follow-up; Other     Visit Diagnosis:    ICD-10-CM   1. Major depressive disorder, recurrent episode, moderate (HCC) F33.1   2. 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 More positive  son going to collge  finances fluctuate and effect stress level   Takes requip for restless legs at night and also prn ativan to fall asleep otheriwse has worries that keeps him up Modifying factors wife Duration since age 57 Severity imprvoed now. 7/10. 10 being no depression       Substance Abuse History in the last 12 months:  Yes.    Consequences of Substance Abuse: Medical Consequences:  amotivation, depression.  Past Medical History:  Past Medical History:  Diagnosis Date  . Borderline systolic HTN   . HLD (hyperlipidemia)    History reviewed. No pertinent surgical history.  Family Psychiatric History: Mother depression Brother bipolar  Family History:  Family History  Problem Relation Age of Onset  . Heart disease Father     Social History:   Social History   Socioeconomic History  . Marital status: Married    Spouse name: Not on file  . Number of children: Not on file  . Years of education: Not on file  . Highest education level: Not on file  Occupational History  . Not on file  Social Needs  . Financial resource strain: Not on file  . Food insecurity:    Worry: Not on file    Inability: Not on file  . Transportation needs:    Medical: Not on file    Non-medical: Not on file  Tobacco Use  . Smoking status: Never Smoker  . Smokeless tobacco: Never Used  Substance and Sexual Activity  .  Alcohol use: Yes    Comment: occasionally  . Drug use: No  . Sexual activity: Not on file  Lifestyle  . Physical activity:    Days per week: Not on file    Minutes per session: Not on file  . Stress: Not on file  Relationships  . Social connections:    Talks on phone: Not on file    Gets together: Not on file    Attends religious service: Not on file    Active member of club or organization: Not on file    Attends meetings of clubs or organizations: Not on file    Relationship status: Not on file  Other Topics Concern  . Not on file  Social History Narrative  . Not on file     Allergies:   Allergies  Allergen Reactions  . Epinephrine     Shortness of breath, sweats    Metabolic Disorder Labs: No results found for: HGBA1C, MPG No results found for: PROLACTIN No results found for: CHOL, TRIG, HDL, CHOLHDL, VLDL, LDLCALC   Current Medications: Current Outpatient Medications  Medication Sig Dispense Refill  . PARoxetine (PAXIL) 30 MG tablet Take 1 tablet (30 mg total) by mouth daily. 30 tablet 2  . testosterone cypionate (DEPOTESTOTERONE CYPIONATE) 100 MG/ML injection Inject 200 mg into the muscle every 14 (fourteen) days. For IM use only    . traZODone (  DESYREL) 50 MG tablet Take 1 tablet (50 mg total) by mouth at bedtime. 30 tablet 1   No current facility-administered medications for this visit.       Psychiatric Specialty Exam: Review of Systems  Cardiovascular: Negative for chest pain.  Skin: Negative for rash.  Psychiatric/Behavioral: Negative for suicidal ideas.    Blood pressure 120/88, pulse 62, height 6\' 3"  (1.905 m), weight 245 lb (111.1 kg).Body mass index is 30.62 kg/m.  General Appearance: Casual  Eye Contact:  Good  Speech:  Normal Rate  Volume:  Decreased  Mood:  fair  Affect:  Congruent  Thought Process:  Goal Directed  Orientation:  Full (Time, Place, and Person)  Thought Content:  Rumination  Suicidal Thoughts:  No  Homicidal Thoughts:   No  Memory:  Immediate;   Fair Recent;   Poor  Judgement:  Fair  Insight:  Fair  Psychomotor Activity:  Decreased  Concentration:  Concentration: Fair and Attention Span: Fair  Recall:  [employer] of Knowledge:Fair  Language: Fair  Akathisia:  Negative  Handed:  Right  AIMS (if indicated):  0  Assets:  Desire for Improvement  ADL's:  Intact  Cognition: WNL  Sleep:  Fair to variable poor at times    Treatment Plan Summary: Medication management and Plan as follows  1. Major depression recurrent moderate:improved. Continue paxil 30mg  2. GAD: fluctuates. Continue paxil 3. Insomnia:not worse. Take trazadone prn Fu 29m Thresa Ross, MD 7/26/201910:37 AM

## 2018-07-24 ENCOUNTER — Ambulatory Visit (INDEPENDENT_AMBULATORY_CARE_PROVIDER_SITE_OTHER): Payer: BLUE CROSS/BLUE SHIELD | Admitting: Psychiatry

## 2018-07-24 ENCOUNTER — Other Ambulatory Visit: Payer: Self-pay | Admitting: Podiatry

## 2018-07-24 ENCOUNTER — Encounter: Payer: Self-pay | Admitting: Podiatry

## 2018-07-24 ENCOUNTER — Ambulatory Visit (INDEPENDENT_AMBULATORY_CARE_PROVIDER_SITE_OTHER): Payer: BLUE CROSS/BLUE SHIELD

## 2018-07-24 ENCOUNTER — Encounter (HOSPITAL_COMMUNITY): Payer: Self-pay | Admitting: Psychiatry

## 2018-07-24 ENCOUNTER — Ambulatory Visit: Payer: BLUE CROSS/BLUE SHIELD | Admitting: Podiatry

## 2018-07-24 VITALS — BP 134/82 | HR 68 | Ht 75.0 in | Wt 243.0 lb

## 2018-07-24 DIAGNOSIS — F411 Generalized anxiety disorder: Secondary | ICD-10-CM

## 2018-07-24 DIAGNOSIS — M779 Enthesopathy, unspecified: Secondary | ICD-10-CM

## 2018-07-24 DIAGNOSIS — F331 Major depressive disorder, recurrent, moderate: Secondary | ICD-10-CM

## 2018-07-24 DIAGNOSIS — M79671 Pain in right foot: Secondary | ICD-10-CM

## 2018-07-24 MED ORDER — TRIAMCINOLONE ACETONIDE 10 MG/ML IJ SUSP
10.0000 mg | Freq: Once | INTRAMUSCULAR | Status: AC
  Administered 2018-07-24: 10 mg

## 2018-07-24 MED ORDER — PAROXETINE HCL 30 MG PO TABS: 30.0000 mg | ORAL_TABLET | Freq: Every day | ORAL | 2 refills | Status: AC

## 2018-07-24 MED ORDER — TRAZODONE HCL 50 MG PO TABS: 50.0000 mg | ORAL_TABLET | Freq: Every day | ORAL | 0 refills | Status: AC

## 2018-07-24 NOTE — Progress Notes (Signed)
[facility] Outpatient Follow up visit  Patient Identification: Mario Burke MRN:  [id number] Date of Evaluation:  07/24/2018 Referral Source: primary care Chief Complaint:   Chief Complaint    Follow-up     Visit Diagnosis:    ICD-10-CM   1. Major depressive disorder, recurrent episode, moderate (HCC) F33.1   2. 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 7/10. 10 being no depression       Substance Abuse History in the last 12 months:  Yes.    Consequences of Substance Abuse: Medical Consequences:  amotivation, depression.  Past Medical History:  Past Medical History:  Diagnosis Date  . Borderline systolic HTN   . HLD (hyperlipidemia)    History reviewed. No pertinent surgical history.  Family Psychiatric History: Mother depression Brother bipolar  Family History:  Family History  Problem Relation Age of Onset  . Heart disease Father     Social History:   Social History   Socioeconomic History  . Marital status: Married    Spouse name: Not on file  . Number of children: Not on file  . Years of education: Not on file  . Highest education level: Not on file  Occupational History  . Not on file  Social Needs  . Financial resource strain: Not on file  . Food insecurity:    Worry: Not on file    Inability: Not on file  . Transportation needs:    Medical: Not on file    Non-medical: Not on file  Tobacco Use  . Smoking status: Never Smoker  . Smokeless tobacco: Never Used  Substance and Sexual Activity  .  Alcohol use: Yes    Comment: occasionally  . Drug use: No  . Sexual activity: Not on file  Lifestyle  . Physical activity:    Days per week: Not on file    Minutes per session: Not on file  . Stress: Not on file  Relationships  . Social connections:    Talks on phone: Not on file    Gets together: Not on file    Attends religious service: Not on file    Active member of club or organization: Not on file    Attends meetings of clubs or organizations: Not on file    Relationship status: Not on file  Other Topics Concern  . Not on file  Social History Narrative  . Not on file     Allergies:   Allergies  Allergen Reactions  . Epinephrine     Shortness of breath, sweats    Metabolic Disorder Labs: No results found for: HGBA1C, MPG No results found for: PROLACTIN No results found for: CHOL, TRIG, HDL, CHOLHDL, VLDL, LDLCALC   Current Medications: Current Outpatient Medications  Medication Sig Dispense Refill  . PARoxetine (PAXIL) 30 MG tablet Take 1 tablet (30 mg total) by mouth daily. 30 tablet 2  . tadalafil (CIALIS) 5 MG tablet Take 5 mg by mouth daily at 2 PM.    . testosterone cypionate (DEPOTESTOTERONE CYPIONATE) 100 MG/ML  injection Inject 200 mg into the muscle every 14 (fourteen) days. For IM use only    . traZODone (DESYREL) 50 MG tablet Take 1 tablet (50 mg total) by mouth at bedtime. 30 tablet 0   No current facility-administered medications for this visit.       Psychiatric Specialty Exam: Review of Systems  Cardiovascular: Negative for palpitations.  Skin: Negative for rash.  Psychiatric/Behavioral: Negative for suicidal ideas.    Blood pressure 134/82, pulse 68, height 6\' 3"  (1.905 m), weight 243 lb (110.2 kg), SpO2 94 %.Body mass index is 30.37 kg/m.  General Appearance: Casual  Eye Contact:  Good  Speech:  Normal Rate  Volume:  Decreased  Mood:  fair  Affect:  Congruent  Thought Process:  Goal Directed  Orientation:  Full (Time, Place, and  Person)  Thought Content:  Rumination  Suicidal Thoughts:  No  Homicidal Thoughts:  No  Memory:  Immediate;   Fair Recent;   Poor  Judgement:  Fair  Insight:  Fair  Psychomotor Activity:  Decreased  Concentration:  Concentration: Fair and Attention Span: Fair  Recall:  [employer] of Knowledge:Fair  Language: Fair  Akathisia:  Negative  Handed:  Right  AIMS (if indicated):  0  Assets:  Desire for Improvement  ADL's:  Intact  Cognition: WNL  Sleep:  Fair to variable poor at times    Treatment Plan Summary: Medication management and Plan as follows  1. Major depression recurrent moderate:better. Continue paxil 2. GAD: fluctuates. Continue paxil 3. Insomnia:not worse. Take trazadone prn Fu 54m Thresa Ross, MD 10/18/201910:46 AM

## 2018-07-24 NOTE — Progress Notes (Signed)
   Subjective:    Patient ID: Mario Burke, male    DOB: 1961-04-28, 57 y.o.   MRN: [id number]  HPI    Review of Systems  All other systems reviewed and are negative.      Objective:   Physical Exam        Assessment & Plan:

## 2018-07-27 NOTE — Progress Notes (Signed)
Subjective:   Patient ID: Mario Burke, male   DOB: 57 y.o.   MRN: [id number]   HPI Patient presents with painful area on the dorsal lateral aspect of the right foot with inflammation fluid buildup noted that is making it hard to wear shoe gear comfortably.  Patient also has moderate depression of the arch and is noted to have pain with ambulation and states orthotics of help with her starting to wear out   ROS      Objective:  Physical Exam  Neurovascular status intact with patient found to have moderate inflammation plantar aspect heel and arch of bilateral inflammation fluid at the medial band of the insertional point of the calcaneus to the plantar fascia     Assessment:  Plantar  fasciitis right with inflammation fluid around the medial band     Plan:  H&P condition reviewed and at this point explained conservative care to patient.  I injected the plantar fascial right 3 mg Kenalog 5 mg Xylocaine applied fascial brace with instructions on usage and gave instructions for supportive shoe gear usage.  Patient will be seen back to recheck  X-rays were negative for signs of fracture and did indicate small spur with no indications of other pathology

## 2018-08-18 ENCOUNTER — Ambulatory Visit: Payer: BLUE CROSS/BLUE SHIELD | Admitting: Orthotics

## 2018-08-18 DIAGNOSIS — M79671 Pain in right foot: Secondary | ICD-10-CM

## 2018-08-18 DIAGNOSIS — M779 Enthesopathy, unspecified: Secondary | ICD-10-CM

## 2018-08-18 NOTE — Progress Notes (Signed)
Patient came in today to pick up custom made foot orthotics.  The goals were accomplished and the patient reported no dissatisfaction with said orthotics.  Patient was advised of breakin period and how to report any issues. 

## 2018-10-30 ENCOUNTER — Ambulatory Visit (HOSPITAL_COMMUNITY): Payer: BLUE CROSS/BLUE SHIELD | Admitting: Psychiatry
